# Patient Record
Sex: Male | Born: 1990 | Race: Black or African American | Hispanic: No | Marital: Single | Smoking: Current some day smoker
Health system: Southern US, Community
[De-identification: ages and names within clinical notes are randomized; demographics above are authoritative.]

---

## 2009-01-29 ENCOUNTER — Emergency Department (HOSPITAL_COMMUNITY): Admission: EM | Admit: 2009-01-29 | Discharge: 2009-01-29 | Payer: Self-pay | Admitting: Emergency Medicine

## 2010-03-08 ENCOUNTER — Emergency Department (HOSPITAL_COMMUNITY): Admission: EM | Admit: 2010-03-08 | Discharge: 2010-03-08 | Payer: Self-pay | Admitting: Emergency Medicine

## 2010-09-23 LAB — URINE MICROSCOPIC-ADD ON

## 2010-09-23 LAB — URINALYSIS, ROUTINE W REFLEX MICROSCOPIC
Glucose, UA: NEGATIVE mg/dL
Specific Gravity, Urine: 1.015 (ref 1.005–1.030)
pH: 6 (ref 5.0–8.0)

## 2010-09-23 LAB — DIFFERENTIAL
Lymphs Abs: 3.7 10*3/uL (ref 0.7–4.0)
Monocytes Absolute: 0.3 10*3/uL (ref 0.1–1.0)
Monocytes Relative: 5 % (ref 3–12)
Neutro Abs: 2.3 10*3/uL (ref 1.7–7.7)
Neutrophils Relative %: 35 % — ABNORMAL LOW (ref 43–77)

## 2010-09-23 LAB — CBC
Hemoglobin: 14.3 g/dL (ref 13.0–17.0)
MCHC: 32.5 g/dL (ref 30.0–36.0)
Platelets: 267 10*3/uL (ref 150–400)
RDW: 13.5 % (ref 11.5–15.5)

## 2010-09-23 LAB — COMPREHENSIVE METABOLIC PANEL
ALT: 10 U/L (ref 0–53)
Albumin: 4.3 g/dL (ref 3.5–5.2)
BUN: 9 mg/dL (ref 6–23)
Calcium: 9.3 mg/dL (ref 8.4–10.5)
Glucose, Bld: 151 mg/dL — ABNORMAL HIGH (ref 70–99)
Sodium: 140 mEq/L (ref 135–145)
Total Protein: 7.4 g/dL (ref 6.0–8.3)

## 2010-09-23 LAB — ETHANOL: Alcohol, Ethyl (B): 177 mg/dL — ABNORMAL HIGH (ref 0–10)

## 2010-09-23 LAB — RAPID URINE DRUG SCREEN, HOSP PERFORMED
Amphetamines: NOT DETECTED
Barbiturates: NOT DETECTED

## 2012-09-27 ENCOUNTER — Emergency Department (HOSPITAL_COMMUNITY)
Admission: EM | Admit: 2012-09-27 | Discharge: 2012-09-27 | Disposition: A | Discharge status: Home / Self Care | Payer: Self-pay

## 2013-11-29 ENCOUNTER — Emergency Department (HOSPITAL_COMMUNITY)
Admission: EM | Admit: 2013-11-29 | Discharge: 2013-11-29 | Disposition: A | Discharge status: Home / Self Care | Payer: Self-pay | Attending: Emergency Medicine | Admitting: Emergency Medicine

## 2013-11-29 ENCOUNTER — Encounter (HOSPITAL_COMMUNITY): Payer: Self-pay | Admitting: Emergency Medicine

## 2013-11-29 DIAGNOSIS — M62830 Muscle spasm of back: Secondary | ICD-10-CM

## 2013-11-29 DIAGNOSIS — F17200 Nicotine dependence, unspecified, uncomplicated: Secondary | ICD-10-CM | POA: Insufficient documentation

## 2013-11-29 DIAGNOSIS — M5380 Other specified dorsopathies, site unspecified: Secondary | ICD-10-CM | POA: Insufficient documentation

## 2013-11-29 MED ORDER — CYCLOBENZAPRINE HCL 5 MG PO TABS: 5.0000 mg | ORAL_TABLET | Freq: Two times a day (BID) | ORAL | Status: DC | PRN

## 2013-11-29 MED ORDER — HYDROCODONE-ACETAMINOPHEN 5-325 MG PO TABS: 1.0000 | ORAL_TABLET | Freq: Four times a day (QID) | ORAL | Status: DC | PRN

## 2013-11-29 NOTE — ED Notes (Signed)
Started with right subscapular pain last pm. States was helping someone 'move' this weekend. Pain worse with deep breathing. Lungs clear.

## 2013-11-29 NOTE — ED Provider Notes (Signed)
  Medical screening examination/treatment/procedure(s) were performed by non-physician practitioner and as supervising physician I was immediately available for consultation/collaboration.   EKG Interpretation None         Gerhard Munchobert Kamyla Olejnik, MD 11/29/13 1228

## 2013-11-29 NOTE — Discharge Instructions (Signed)
Musculoskeletal Pain °Musculoskeletal pain is muscle and boney aches and pains. These pains can occur in any part of the body. Your caregiver may treat you without knowing the cause of the pain. They may treat you if blood or urine tests, X-rays, and other tests were normal.  °CAUSES °There is often not a definite cause or reason for these pains. These pains may be caused by a type of germ (virus). The discomfort may also come from overuse. Overuse includes working out too hard when your body is not fit. Boney aches also come from weather changes. Bone is sensitive to atmospheric pressure changes. °HOME CARE INSTRUCTIONS  °· Ask when your test results will be ready. Make sure you get your test results. °· Only take over-the-counter or prescription medicines for pain, discomfort, or fever as directed by your caregiver. If you were given medications for your condition, do not drive, operate machinery or power tools, or sign legal documents for 24 hours. Do not drink alcohol. Do not take sleeping pills or other medications that may interfere with treatment. °· Continue all activities unless the activities cause more pain. When the pain lessens, slowly resume normal activities. Gradually increase the intensity and duration of the activities or exercise. °· During periods of severe pain, bed rest may be helpful. Lay or sit in any position that is comfortable. °· Putting ice on the injured area. °· Put ice in a bag. °· Place a towel between your skin and the bag. °· Leave the ice on for 15 to 20 minutes, 3 to 4 times a day. °· Follow up with your caregiver for continued problems and no reason can be found for the pain. If the pain becomes worse or does not go away, it may be necessary to repeat tests or do additional testing. Your caregiver may need to look further for a possible cause. °SEEK IMMEDIATE MEDICAL CARE IF: °· You have pain that is getting worse and is not relieved by medications. °· You develop chest pain  that is associated with shortness or breath, sweating, feeling sick to your stomach (nauseous), or throw up (vomit). °· Your pain becomes localized to the abdomen. °· You develop any new symptoms that seem different or that concern you. °MAKE SURE YOU:  °· Understand these instructions. °· Will watch your condition. °· Will get help right away if you are not doing well or get worse. °Document Released: 06/03/2005 Document Revised: 08/26/2011 Document Reviewed: 02/05/2013 °Exit[employer]® Patient Information ©2014 [employer]. ° °

## 2013-11-29 NOTE — ED Provider Notes (Signed)
CSN: [id number]     Arrival date & time 11/29/13  [id number] History  This chart was scribed for non-physician practitioner Teressa LowerVrinda Shanikia Kernodle, NP working with Gerhard Munchobert Lockwood, MD by Joaquin MusicKristina Sanchez-Matthews, ED Scribe. This patient was seen in room TR08C/TR08C and the patient's care was started at 10:00 AM .   Chief Complaint  Patient presents with  . Back Pain   The history is provided by the patient. No language interpreter was used.   HPI Comments: Jonathon Schneider is a 23 y.o. male who presents to the Emergency Department complaining of back pain and spasms that began yesterday afternoon. Pt states he suspects he is having back spasms and states he was lifting and moving items yesterday evening; he was having pain while sleeping due to spasms and back pain. Denies having numbness, weakness, and health disorders.  History reviewed. No pertinent past medical history. History reviewed. No pertinent past surgical history. No family history on file. History  Substance Use Topics  . Smoking status: Current Some Day Smoker  . Smokeless tobacco: Not on file  . Alcohol Use: Yes    Review of Systems  Musculoskeletal: Positive for back pain and myalgias. Negative for gait problem.  Neurological: Negative for weakness and numbness.  All other systems reviewed and are negative.  Allergies  Review of patient's allergies indicates not on file.  Home Medications   Prior to Admission medications   Not on File   BP 121/81  Pulse 73  Temp(Src) 98.5 F (36.9 C) (Oral)  SpO2 100%  Physical Exam  Nursing note and vitals reviewed. Constitutional: He is oriented to person, place, and time. He appears well-developed and well-nourished. No distress.  HENT:  Head: Normocephalic and atraumatic.  Eyes: EOM are normal.  Neck: Neck supple. No tracheal deviation present.  Cardiovascular: Normal rate.   Pulmonary/Chest: Effort normal. No respiratory distress.  Musculoskeletal: Normal range of motion.  R  thoracic paraspinal tenderness and muscle spasms. Moving all extremities without any problems.  Neurological: He is alert and oriented to person, place, and time.  Skin: Skin is warm and dry.  Psychiatric: He has a normal mood and affect. His behavior is normal.   ED Course  Procedures  DIAGNOSTIC STUDIES: Oxygen Saturation is 100% on RA, normal by my interpretation.    COORDINATION OF CARE: 10:02 AM-Discussed treatment plan which includes discharge pt with pain medication and muscle relaxer. Encouraged pt to avoid heavy lifting. Pt agreed to plan.   Labs Review Labs Reviewed - No data to display  Imaging Review No results found.   EKG Interpretation None     MDM   Final diagnoses:  Muscle spasm of back    neurovascularly intact. Don't think imaging is needed at this time. Pt given ortho follow up as needed  I personally performed the services described in this documentation, which was scribed in my presence. The recorded information has been reviewed and is accurate.    Teressa LowerVrinda Jamair Cato, NP 11/29/13 1009

## 2013-12-25 ENCOUNTER — Emergency Department (HOSPITAL_COMMUNITY)
Admission: EM | Admit: 2013-12-25 | Discharge: 2013-12-25 | Disposition: A | Discharge status: Home / Self Care | Payer: No Typology Code available for payment source | Attending: Emergency Medicine | Admitting: Emergency Medicine

## 2013-12-25 ENCOUNTER — Encounter (HOSPITAL_COMMUNITY): Payer: Self-pay | Admitting: Emergency Medicine

## 2013-12-25 DIAGNOSIS — Y9289 Other specified places as the place of occurrence of the external cause: Secondary | ICD-10-CM | POA: Insufficient documentation

## 2013-12-25 DIAGNOSIS — S46909A Unspecified injury of unspecified muscle, fascia and tendon at shoulder and upper arm level, unspecified arm, initial encounter: Secondary | ICD-10-CM | POA: Insufficient documentation

## 2013-12-25 DIAGNOSIS — F17200 Nicotine dependence, unspecified, uncomplicated: Secondary | ICD-10-CM | POA: Insufficient documentation

## 2013-12-25 DIAGNOSIS — S4980XA Other specified injuries of shoulder and upper arm, unspecified arm, initial encounter: Secondary | ICD-10-CM | POA: Insufficient documentation

## 2013-12-25 DIAGNOSIS — M79622 Pain in left upper arm: Secondary | ICD-10-CM

## 2013-12-25 DIAGNOSIS — Y9389 Activity, other specified: Secondary | ICD-10-CM | POA: Insufficient documentation

## 2013-12-25 MED ORDER — IBUPROFEN 800 MG PO TABS: 800.0000 mg | ORAL_TABLET | Freq: Three times a day (TID) | ORAL | Status: DC | PRN

## 2013-12-25 MED ORDER — CYCLOBENZAPRINE HCL 10 MG PO TABS: 10.0000 mg | ORAL_TABLET | Freq: Three times a day (TID) | ORAL | Status: DC | PRN
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
## 2013-12-25 NOTE — ED Provider Notes (Signed)
Medical screening examination/treatment/procedure(s) were performed by non-physician practitioner and as supervising physician I was immediately available for consultation/collaboration.   EKG Interpretation None        Glynn OctaveStephen Kazuki Ingle, MD 12/25/13 2306

## 2014-02-19 ENCOUNTER — Emergency Department (HOSPITAL_COMMUNITY)
Admission: EM | Admit: 2014-02-19 | Discharge: 2014-02-19 | Disposition: A | Discharge status: Home / Self Care | Payer: Self-pay | Attending: Emergency Medicine | Admitting: Emergency Medicine

## 2014-02-19 ENCOUNTER — Emergency Department (HOSPITAL_COMMUNITY): Payer: Self-pay

## 2014-02-19 ENCOUNTER — Encounter (HOSPITAL_COMMUNITY): Payer: Self-pay | Admitting: Emergency Medicine

## 2014-02-19 DIAGNOSIS — M25521 Pain in right elbow: Secondary | ICD-10-CM

## 2014-02-19 DIAGNOSIS — S59909A Unspecified injury of unspecified elbow, initial encounter: Secondary | ICD-10-CM | POA: Insufficient documentation

## 2014-02-19 DIAGNOSIS — Y929 Unspecified place or not applicable: Secondary | ICD-10-CM | POA: Insufficient documentation

## 2014-02-19 DIAGNOSIS — S59919A Unspecified injury of unspecified forearm, initial encounter: Principal | ICD-10-CM

## 2014-02-19 DIAGNOSIS — R296 Repeated falls: Secondary | ICD-10-CM | POA: Insufficient documentation

## 2014-02-19 DIAGNOSIS — Y939 Activity, unspecified: Secondary | ICD-10-CM | POA: Insufficient documentation

## 2014-02-19 DIAGNOSIS — S6990XA Unspecified injury of unspecified wrist, hand and finger(s), initial encounter: Principal | ICD-10-CM

## 2014-02-19 DIAGNOSIS — F17200 Nicotine dependence, unspecified, uncomplicated: Secondary | ICD-10-CM | POA: Insufficient documentation

## 2014-02-19 DIAGNOSIS — W19XXXA Unspecified fall, initial encounter: Secondary | ICD-10-CM

## 2014-02-19 MED ORDER — HYDROCODONE-ACETAMINOPHEN 5-325 MG PO TABS: 1.0000 | ORAL_TABLET | ORAL | Status: AC | PRN

## 2014-02-19 MED ORDER — HYDROCODONE-ACETAMINOPHEN 5-325 MG PO TABS
1.0000 | ORAL_TABLET | Freq: Once | ORAL | Status: AC
  Administered 2014-02-19: 1 via ORAL
  Filled 2014-02-19: qty 1

## 2014-02-19 NOTE — Discharge Instructions (Signed)
1. Medications: vicodin for severe pain, ibuprofen for moderate pain, usual home medications 2. Treatment: rest, drink plenty of fluids, ice, use sling 3. Follow Up: Please followup with your primary doctor for discussion of your diagnoses and further evaluation after today's visit; if you do not have a primary care doctor use the resource guide provided to find one;     Arthralgia Your caregiver has diagnosed you as suffering from an arthralgia. Arthralgia means there is pain in a joint. This can come from many reasons including:  Bruising the joint which causes soreness (inflammation) in the joint.  Wear and tear on the joints which occur as we grow older (osteoarthritis).  Overusing the joint.  Various forms of arthritis.  Infections of the joint. Regardless of the cause of pain in your joint, most of these different pains respond to anti-inflammatory drugs and rest. The exception to this is when a joint is infected, and these cases are treated with antibiotics, if it is a bacterial infection. HOME CARE INSTRUCTIONS   Rest the injured area for as long as directed by your caregiver. Then slowly start using the joint as directed by your caregiver and as the pain allows. Crutches as directed may be useful if the ankles, knees or hips are involved. If the knee was splinted or casted, continue use and care as directed. If an stretchy or elastic wrapping bandage has been applied today, it should be removed and re-applied every 3 to 4 hours. It should not be applied tightly, but firmly enough to keep swelling down. Watch toes and feet for swelling, bluish discoloration, coldness, numbness or excessive pain. If any of these problems (symptoms) occur, remove the ace bandage and re-apply more loosely. If these symptoms persist, contact your caregiver or return to this location.  For the first 24 hours, keep the injured extremity elevated on pillows while lying down.  Apply ice for 15-20 minutes to  the sore joint every couple hours while awake for the first half day. Then 03-04 times per day for the first 48 hours. Put the ice in a plastic bag and place a towel between the bag of ice and your skin.  Wear any splinting, casting, elastic bandage applications, or slings as instructed.  Only take over-the-counter or prescription medicines for pain, discomfort, or fever as directed by your caregiver. Do not use aspirin immediately after the injury unless instructed by your physician. Aspirin can cause increased bleeding and bruising of the tissues.  If you were given crutches, continue to use them as instructed and do not resume weight bearing on the sore joint until instructed. Persistent pain and inability to use the sore joint as directed for more than 2 to 3 days are warning signs indicating that you should see a caregiver for a follow-up visit as soon as possible. Initially, a hairline fracture (break in bone) may not be evident on X-rays. Persistent pain and swelling indicate that further evaluation, non-weight bearing or use of the joint (use of crutches or slings as instructed), or further X-rays are indicated. X-rays may sometimes not show a small fracture until a week or 10 days later. Make a follow-up appointment with your own caregiver or one to whom we have referred you. A radiologist (specialist in reading X-rays) may read your X-rays. Make sure you know how you are to obtain your X-ray results. Do not assume everything is normal if you do not hear from [place]. SEEK MEDICAL CARE IF: Bruising, swelling, or pain increases.  SEEK IMMEDIATE MEDICAL CARE IF:   Your fingers or toes are numb or blue.  The pain is not responding to medications and continues to stay the same or get worse.  The pain in your joint becomes severe.  You develop a fever over 102 F (38.9 C).  It becomes impossible to move or use the joint. MAKE SURE YOU:   Understand these instructions.  Will watch your  condition.  Will get help right away if you are not doing well or get worse. Document Released: 06/03/2005 Document Revised: 08/26/2011 Document Reviewed: 01/20/2008 [facility] Patient Information 2015 [place], [place]. This information is not intended to replace advice given to you by your health care provider. Make sure you discuss any questions you have with your health care provider.    Emergency Department Resource Guide 1) Find a Doctor and Pay Out of Pocket Although you won't have to find out who is covered by your insurance plan, it is a good idea to ask around and get recommendations. You will then need to call the office and see if the doctor you have chosen will accept you as a new patient and what types of options they offer for patients who are self-pay. Some doctors offer discounts or will set up payment plans for their patients who do not have insurance, but you will need to ask so you aren't surprised when you get to your appointment.  2) Contact Your Local Health Department Not all health departments have doctors that can see patients for sick visits, but many do, so it is worth a call to see if yours does. If you don't know where your local health department is, you can check in your phone book. The CDC also has a tool to help you locate your state's health department, and many state websites also have listings of all of their local health departments.  3) Find a Walk-in Clinic If your illness is not likely to be very severe or complicated, you may want to try a walk in clinic. These are popping up all over the country in pharmacies, drugstores, and shopping centers. They're usually staffed by nurse practitioners or physician assistants that have been trained to treat common illnesses and complaints. They're usually fairly quick and inexpensive. However, if you have serious medical issues or chronic medical problems, these are probably not your best option.  No Primary Care  Doctor: - Call Health Connect at  [phone] - they can help you locate a primary care doctor that  accepts your insurance, provides certain services, etc. - Physician Referral Service- [phone]  Chronic Pain Problems: Organization         Address  Phone   Notes  [facility]  [phone] Patients need to be referred by their primary care doctor.   Medication Assistance: Organization         Address  Phone   Notes  [facility] Medication [facility] [address] [place] [place], [place] [address] [phone] --Must be a resident of [facility] -- Must have NO insurance coverage whatsoever (no Medicaid/ Medicare, etc.) -- The pt. MUST have a primary care doctor that directs their care regularly and follows them in the community   MedAssist  [phone]   [employer]  [phone]    Agencies that provide inexpensive medical care: Organization         Address  Phone   Notes  [facility]  [phone]  [facility]    [phone]   [facility] [place], [place] [address] [phone]   [place]. [address], [place] [phone]   Planned Parenthood    [phone]   [facility]    [phone]   [place] and [place] [address], [place] Phone:  [phone], Fax:  [phone] Hours of Operation:  9 am - 6 pm, M-F.  Also accepts Medicaid/Medicare and self-pay.  [facility] for [place] [address] [place], [address], [place] Phone: [phone], Fax: [phone]. Hours of Operation:  8:30 am - 5:30 pm, M-F.  Also accepts Medicaid and self-pay.  [facility] [place] [address] [place] Phone: [phone]   [place], [place], [place] [phone], Ext. [phone] Mondays & Thursdays: 7-9 AM.  First 15 patients are seen on a first  come, first serve basis.    [place] Providers:  Organization         Address  Phone   Notes  [facility] [address], [place] [phone] Also accepts self-pay patients.  [facility] [address], [place]  [phone]   [place], [address], [place] [phone]   [facility] [address], [place] [phone]   Lucianne Lei [address], [place]   [phone] Only accepts [place] Access [place] patients after they have their name applied to their card.   Self-Pay (no insurance) in [facility]:  Organization         Address  Phone   Notes  Sickle Cell Patients, [facility] [place] [phone]   [facility] [place] [phone]   [facility] [place]  [place], [place], [place] [phone]   [facility]/Dr. Osei-Bonsu  [address], [place] or [address], [place] [phone] Phone number for both [place] and [place] locations is the same.  Urgent Medical and [facility] [address] [place] [phone]   [facility] [address] [place] or [address] [phone]   [facility] [place] [address] [place] [phone], phone; [phone], fax Sees patients 1st and 3rd Saturday of every month.  Must not qualify for public or private insurance (i.e. Medicaid, Medicare,  Health Choice, Veterans' Benefits)  Household income should be no more than 200% of the poverty level The clinic cannot treat you if you are pregnant or think you are pregnant  Sexually transmitted diseases are not treated at the clinic.    Dental Care: Organization          Address  Phone  Notes  [facility] of [facility] [place] [phone] Accepts children up to age 37 who are enrolled in [place] or [place]; pregnant women with a Medicaid card; and children who have applied for Medicaid or  Health Choice, but were declined, whose parents can pay a reduced fee at time of service.  [facility] Department of [facility]  [address],  [place] [phone] Accepts children up to age 97 who are enrolled in Medicaid or [place] Health Choice; pregnant women with a Medicaid card; and children who have applied for Medicaid or [place] Health Choice, but were declined, whose parents can pay a reduced fee at time of service.  [place] Adult [facility] Access PROGRAM  [place] [phone] Patients are seen by appointment only. Walk-ins are not accepted. [place] will see patients 6 years of age and older. Monday - Tuesday (8am-5pm) Most Wednesdays (8:30-5pm) $30 per visit, cash only  [facility] Adult Dental Access PROGRAM  [address], [facility] [phone] Patients are seen by appointment only. Walk-ins are not accepted. [place] will see patients 70 years of age and older. One Wednesday Evening (Monthly: Volunteer Based).  $30 per visit, cash only  [place]  [phone] for adults; Children under age 35, call Graduate Pediatric Dentistry at [phone]. Children aged 36-14, please call [phone] to request a pediatric application.  Dental services are provided in all areas of dental care including fillings, crowns and bridges, complete and partial dentures, implants, gum treatment, root canals, and extractions. Preventive care is also provided. Treatment is provided to both adults and children. Patients are selected via a lottery and there is often a waiting list.   [facility] [address], [place]  [phone] [email or web address]   [facility] [address] [place], [place] [phone], Ext. [phone] Second and Fourth Thursday of each month, opens at 6:30 AM; Clinic ends at 9 AM.  Patients are seen on a first-come first-served basis, and a limited number are seen during each clinic.   [facility]  [address] Hillard Danker [place], [place] [phone]   Eligibility Requirements You must have lived in [place], [place], or [place] counties for at least the last three months.   You cannot be eligible for state or federal sponsored [employer], including [employer], [place], or [employer].   You generally cannot be eligible for healthcare insurance through your employer.    How to apply: Eligibility screenings are held every Tuesday and Wednesday afternoon from 1:00 pm until 4:00 pm. You do not need an appointment for the interview!  [facility] [address] [place], [place]   [place]  [place] Department  [place]  [phone]    Behavioral Health Resources in the Community: Intensive Outpatient Programs Organization         Address  Phone  Notes  [place]. [address] [place], [place] [phone]   [facility] Outpatient [address] [place], [place]   ADS: Alcohol & [facility] S[facility] [address] [place], [place]   [place] [address]  [place], [place] or [phone]   Substance Abuse Resources Organization         Address  Phone  Notes  Alcohol and Drug Services  [phone]   [place]  [phone]   The [place]   Chinita Pester  [phone]   Residential & Outpatient Substance Abuse Program  [phone]   Psychological  Services Organization         Address  Phone  Notes  [place]  [place]  [phone]  [phone] [address] [place] or [phone]    Mobile Crisis Teams Organization         Address  Phone  Notes  [facility], Mobile Crisis Care Unit  [phone]   [facility]  [address] [place], [place]   [place] [address], [place] [phone]    Self-Help/Support Groups Organization         Address  Phone             Notes  [place]. of [place] - variety of support groups  [place] Call for more information  Narcotics Anonymous (NA), [facility] [address], [employer] [place]  2 meetings at this location   Special educational needs teacher         Address  Phone  Notes  ASAP Residential Treatment [place],    [place]  [phone]   [facility]  [address], [place] [address], [place], [place]   [place], [place] [phone] Admissions: 8am-3pm M-F  Incentives Substance [place] [address]    [place], [place] [phone]   [facility] [address] [place], [place], [place]   [facility] [address],  [place], [place]   [facility] Programs - Intensive Outpatient [address] Kristeen Mans [address], [place], [place]   [facility] ([place].,  [place], [place] [phone] or [phone]   [facility] Treatment Services (RTS) [address] [place], [place] Accepts Medicaid  [facility] [place] [address],  [place] [phone] Substance Abuse/Addiction Treatment   [facility] Organization         Address  Phone  Notes  [facility]  [phone]   Domenic Schwab, PhD [address] Arlis Porta [place], [place]   [phone] or [phone]   [place], [place] [phone]   [facility] [address], [place], [place] [phone] Insurance/Medicaid/sponsorship through [facility] and Families [address] [place]                                    [place], [place] [phone] [place] [address] [place] [address], [place] [phone]    Dr. Adele Schilder  [phone]   [facility] Dept. 1) [address] [place] 2) [place] 3)  [place] 65, [place] [phone]  [phone]   [place] [phone] or [phone] (After Hours)

## 2014-02-19 NOTE — ED Notes (Signed)
Reports falling last night and now having pain to right elbow and forearm.

## 2014-02-19 NOTE — ED Provider Notes (Signed)
CSN: [id number]     Arrival date & time 02/19/14  1832 History   None    This chart was scribed for non-physician practitioner, Dierdre Forth PA-C working with Jonathon Maw Ward, DO by Arlan Organ, ED Scribe. This patient was seen in room TR10C/TR10C and the patient's care was started at 10:06 PM.   Chief Complaint  Patient presents with  . Fall  . Arm Pain   The history is provided by the patient. No language interpreter was used.    HPI Comments: Jonathon Schneider is a 23 y.o. male who presents to the Emergency Department complaining of a fall that occurred last night. Pt states he is unaware of mechanism of fall as he does not remember what happened. Pt admits to heavy EtOH usage last night.  However, pt states he is sure he landed on concrete. His friends report that he did not hit his head or have an LOC.  Now c/o constant, moderate R elbow and forearm pain that is unchanged. Currently rates pain at rest 3/10 and 7/10 with movement. Pain is exacerbated with all movements of the shoulder that require elbow movement. No alleviating factors at this time. He has tried OTC pain medications without any improvement for symptoms. Last dose about 4-5 hours prior to arrival. He denies any fever or chills. No numbness or loss of sensation. No known allergies to medications. No other concerns this visit.  History reviewed. No pertinent past medical history. History reviewed. No pertinent past surgical history. History reviewed. No pertinent family history. History  Substance Use Topics  . Smoking status: Current Some Day Smoker  . Smokeless tobacco: Not on file  . Alcohol Use: Yes    Review of Systems  Constitutional: Negative for fever and chills.  Gastrointestinal: Negative for nausea and vomiting.  Musculoskeletal: Positive for arthralgias and joint swelling. Negative for back pain, neck pain and neck stiffness.  Skin: Negative for wound.  Neurological: Negative for weakness and numbness.   Hematological: Does not bruise/bleed easily.  Psychiatric/Behavioral: The patient is not nervous/anxious.   All other systems reviewed and are negative.     Allergies  Review of patient's allergies indicates no known allergies.  Home Medications   Prior to Admission medications   Medication Sig Start Date End Date Taking? Authorizing Provider  HYDROcodone-acetaminophen (NORCO/VICODIN) 5-325 MG per tablet Take 1 tablet by mouth every 4 (four) hours as needed for moderate pain or severe pain. 02/19/14   Muath Hallam, PA-C   Triage Vitals: BP 121/62  Pulse 71  Temp(Src) 98.5 F (36.9 C) (Oral)  Resp 16  SpO2 100%   Physical Exam  Nursing note and vitals reviewed. Constitutional: He appears well-developed and well-nourished. No distress.  HENT:  Head: Normocephalic and atraumatic.  Eyes: Conjunctivae are normal.  Neck: Normal range of motion.  Cardiovascular: Normal rate, regular rhythm, normal heart sounds and intact distal pulses.   No murmur heard. Capillary refill less than 3 seconds  Pulmonary/Chest: Effort normal and breath sounds normal.  Musculoskeletal: He exhibits tenderness. He exhibits no edema.  FROM of R shoulder, R wrist, and all fingers of R hand without any pain Full active ROM of elbow without significant pain swelling noted to R elbow and proximal forearm Pain to palpation of olecranon  No laceration or abrasions to RUE  Neurological: He is alert. Coordination normal.  Sensation intact dull and sharp Strength 5/5 in RUE except flexion and extension of R elbow which is 3/5 due to pain  Skin: Skin is warm and dry. He is not diaphoretic.  No tenting of the skin  Psychiatric: He has a normal mood and affect.    ED Course  Procedures (including critical care time)  DIAGNOSTIC STUDIES: Oxygen Saturation is 99% on RA, Normal by my interpretation.    COORDINATION OF CARE: 10:06 PM- Will give Norco to help manage symptoms. Will order DG elbow  complete R. Discussed treatment plan with pt at bedside and pt agreed to plan.     Labs Review Labs Reviewed - No data to display  Imaging Review Dg Elbow Complete Right  02/19/2014   CLINICAL DATA:  Right elbow pain posteriorly after a fall yesterday.  EXAM: RIGHT ELBOW - COMPLETE 3+ VIEW  COMPARISON:  None.  FINDINGS: There is no evidence of fracture, dislocation, or joint effusion. There is no evidence of arthropathy or other focal bone abnormality. Soft tissues are unremarkable.  IMPRESSION: Negative.   Electronically Signed   By: Burman Nieves M.D.   On: 02/19/2014 21:09     EKG Interpretation None      MDM   Final diagnoses:  Fall, initial encounter  Right elbow pain   Jonathon Schneider presents with right elbow pain after fall.  No pain anywhere else.  Patient X-Ray negative for obvious fracture or dislocation. Pain managed in ED. Pt advised to follow up with orthopedics in 1 week if symptoms persist for possibility of missed fracture diagnosis. Patient given sling while in ED, conservative therapy recommended and discussed. Patient will be dc home & is agreeable with above plan.  BP 121/62  Pulse 71  Temp(Src) 98.5 F (36.9 C) (Oral)  Resp 16  SpO2 100%  I personally performed the services described in this documentation, which was scribed in my presence. The recorded information has been reviewed and is accurate.    Jonathon Client [place], PA-C 02/19/14 2206

## 2014-02-19 NOTE — ED Provider Notes (Signed)
Medical screening examination/treatment/procedure(s) were performed by non-physician practitioner and as supervising physician I was immediately available for consultation/collaboration.   EKG Interpretation None        Ricco Dershem N Nilaya Bouie, DO 02/19/14 2318 

## 2016-10-31 ENCOUNTER — Emergency Department (HOSPITAL_COMMUNITY)
Admission: EM | Admit: 2016-10-31 | Discharge: 2016-10-31 | Disposition: A | Discharge status: Home / Self Care | Payer: BLUE CROSS/BLUE SHIELD | Attending: Emergency Medicine | Admitting: Emergency Medicine

## 2016-10-31 ENCOUNTER — Other Ambulatory Visit: Payer: Self-pay

## 2016-10-31 ENCOUNTER — Emergency Department (HOSPITAL_COMMUNITY): Payer: BLUE CROSS/BLUE SHIELD

## 2016-10-31 ENCOUNTER — Encounter (HOSPITAL_COMMUNITY): Payer: Self-pay | Admitting: Emergency Medicine

## 2016-10-31 DIAGNOSIS — R0789 Other chest pain: Secondary | ICD-10-CM

## 2016-10-31 DIAGNOSIS — F17200 Nicotine dependence, unspecified, uncomplicated: Secondary | ICD-10-CM | POA: Insufficient documentation

## 2016-10-31 DIAGNOSIS — R079 Chest pain, unspecified: Secondary | ICD-10-CM | POA: Diagnosis present

## 2016-10-31 LAB — CBC
HCT: 42.7 % (ref 39.0–52.0)
HEMOGLOBIN: 14.7 g/dL (ref 13.0–17.0)
MCH: 27.4 pg (ref 26.0–34.0)
MCHC: 34.4 g/dL (ref 30.0–36.0)
MCV: 79.7 fL (ref 78.0–100.0)
PLATELETS: 259 10*3/uL (ref 150–400)
RBC: 5.36 MIL/uL (ref 4.22–5.81)
RDW: 13.5 % (ref 11.5–15.5)
WBC: 3.6 10*3/uL — ABNORMAL LOW (ref 4.0–10.5)

## 2016-10-31 LAB — BASIC METABOLIC PANEL
ANION GAP: 9 (ref 5–15)
BUN: 9 mg/dL (ref 6–20)
CALCIUM: 9.5 mg/dL (ref 8.9–10.3)
CO2: 24 mmol/L (ref 22–32)
CREATININE: 0.95 mg/dL (ref 0.61–1.24)
Chloride: 103 mmol/L (ref 101–111)
GFR calc Af Amer: >60 mL/min (ref >60)
GLUCOSE: 108 mg/dL — AB (ref 65–99)
Potassium: 4 mmol/L (ref 3.5–5.1)
Sodium: 136 mmol/L (ref 135–145)

## 2016-10-31 LAB — I-STAT TROPONIN, ED
TROPONIN I, POC: 0 ng/mL (ref 0.00–0.08)
TROPONIN I, POC: 0 ng/mL (ref 0.00–0.08)

## 2016-10-31 NOTE — ED Triage Notes (Signed)
Per EMS: pt sts chest tightness x 1 hour ago while at work with some flushing and SOB that has now resolved

## 2016-10-31 NOTE — ED Notes (Signed)
Pt ambulatory at DC, NAD. Pt verbalizes DC teaching and outpatient resources such as [employer] health and wellness.

## 2016-10-31 NOTE — ED Notes (Signed)
Lobby updated with announcement by RN, apologies for delays provided. 

## 2016-10-31 NOTE — ED Provider Notes (Signed)
[facility]-EMERGENCY DEPT Provider Note   CSN: [id number] Arrival date & time: 10/31/16  1409  By signing my name below, I, Thelma Bargeick Cochran, attest that this documentation has been prepared under the direction and in the presence of No att. providers found. Electronically Signed: Thelma BargeNick Cochran, Scribe. 10/31/16. 6:23 PM.  History   Chief Complaint Chief Complaint  Patient presents with  . Chest Pain   The history is provided by the patient. No language interpreter was used.  HPI Comments: Jonathon Schneider is a 26 y.o. male who presents to the Emergency Department complaining of acute, constant chest pain he describes as a tightness that began around 5 hours ago. He states now his symptoms have resolved. He denies fever, cough, abdominal pain, leg pain, or other associated symptoms. He denies new medications, illicit drug use, vitamins, allergies, or any other pertinent FMHx of heart disease. Pt is a smoker and frequent ETOH drinker.   History reviewed. No pertinent past medical history.  There are no active problems to display for this patient.   History reviewed. No pertinent surgical history.     Home Medications    Prior to Admission medications   Medication Sig Start Date End Date Taking? Authorizing Provider  HYDROcodone-acetaminophen (NORCO/VICODIN) 5-325 MG per tablet Take 1 tablet by mouth every 4 (four) hours as needed for moderate pain or severe pain. 02/19/14   Muthersbaugh, Dahlia ClientHannah, PA-C    Family History History reviewed. No pertinent family history.  Social History Social History  Substance Use Topics  . Smoking status: Current Some Day Smoker  . Smokeless tobacco: Not on file  . Alcohol use Yes     Allergies   Patient has no known allergies.   Review of Systems Review of Systems  Constitutional: Negative for fever.  Respiratory: Negative for cough.   Cardiovascular: Positive for chest pain.  Gastrointestinal: Negative for abdominal pain.  All other systems  reviewed and are negative.    Physical Exam Updated Vital Signs BP (!) 140/94 (BP Location: Left Arm)   Pulse 64   Temp 98.6 F (37 C) (Oral)   Resp 16   SpO2 95%   Physical Exam  Constitutional: He is oriented to person, place, and time. He appears well-developed and well-nourished.  HENT:  Head: Normocephalic and atraumatic.  Cardiovascular: Normal rate and regular rhythm.   No murmur heard. Pulmonary/Chest: Effort normal and breath sounds normal. No respiratory distress.  Abdominal: Soft. There is no tenderness. There is no rebound and no guarding.  Musculoskeletal: He exhibits no edema or tenderness.  Neurological: He is alert and oriented to person, place, and time.  Skin: Skin is warm and dry.  Psychiatric: He has a normal mood and affect. His behavior is normal.  Nursing note and vitals reviewed.    ED Treatments / Results  DIAGNOSTIC STUDIES: Oxygen Saturation is 100% on RA, normal by my interpretation.    COORDINATION OF CARE: 6:19 PM Discussed treatment plan with pt at bedside and pt agreed to plan. Labs (all labs ordered are listed, but only abnormal results are displayed) Labs Reviewed  BASIC METABOLIC PANEL - Abnormal; Notable for the following:       Result Value   Glucose, Bld 108 (*)    All other components within normal limits  CBC - Abnormal; Notable for the following:    WBC 3.6 (*)    All other components within normal limits  I-STAT TROPOININ, ED  I-STAT TROPOININ, ED    EKG  EKG Interpretation  Date/Time:  Thursday Oct 31 2016 14:10:50 EDT Ventricular Rate:  84 PR Interval:  156 QRS Duration: 82 QT Interval:  340 QTC Calculation: 401 R Axis:   65 Text Interpretation:  Normal sinus rhythm ST elevation, consider early repolarization, pericarditis, or injury Abnormal ECG no prior available for comparison Confirmed by Lincoln Brigham [phone]) on 10/31/2016 6:07:14 PM       Radiology Dg Chest 2 View  Result Date: 10/31/2016 CLINICAL DATA:   Shortness of breath, chest pain. EXAM: CHEST  2 VIEW COMPARISON:  None. FINDINGS: The heart size and mediastinal contours are within normal limits. Both lungs are clear. No pneumothorax or pleural effusion is noted. The visualized skeletal structures are unremarkable. IMPRESSION: No active cardiopulmonary disease. Electronically Signed   By: Lupita Raider, M.D.   On: 10/31/2016 14:30    Procedures Procedures (including critical care time)  Medications Ordered in ED Medications - No data to display   Initial Impression / Assessment and Plan / ED Course  I have reviewed the triage vital signs and the nursing notes.  Pertinent labs & imaging results that were available during my care of the patient were reviewed by me and considered in my medical decision making (see chart for details).     Patient here for evaluation of episode of chest pain with shortness of breath. His symptoms have resolved on ED arrival. EKG and presentation are not consistent with ACS, dissection, PE, pneumonia. Discussed with patient unclear etiology symptoms.  Discussed outpatient follow-up and return precautions.  Final Clinical Impressions(s) / ED Diagnoses   Final diagnoses:  Atypical chest pain    New Prescriptions Discharge Medication List as of 10/31/2016  7:14 PM    I personally performed the services described in this documentation, which was scribed in my presence. The recorded information has been reviewed and is accurate.    Tilden Fossa, MD 10/31/16 2027

## 2018-10-07 IMAGING — DX DG CHEST 2V
2 series · 2 of 2 positions shown · non-contrast
Comparison: None.

CLINICAL DATA: Shortness of breath, chest pain.

EXAM:
CHEST  2 VIEW

[w chest pa]
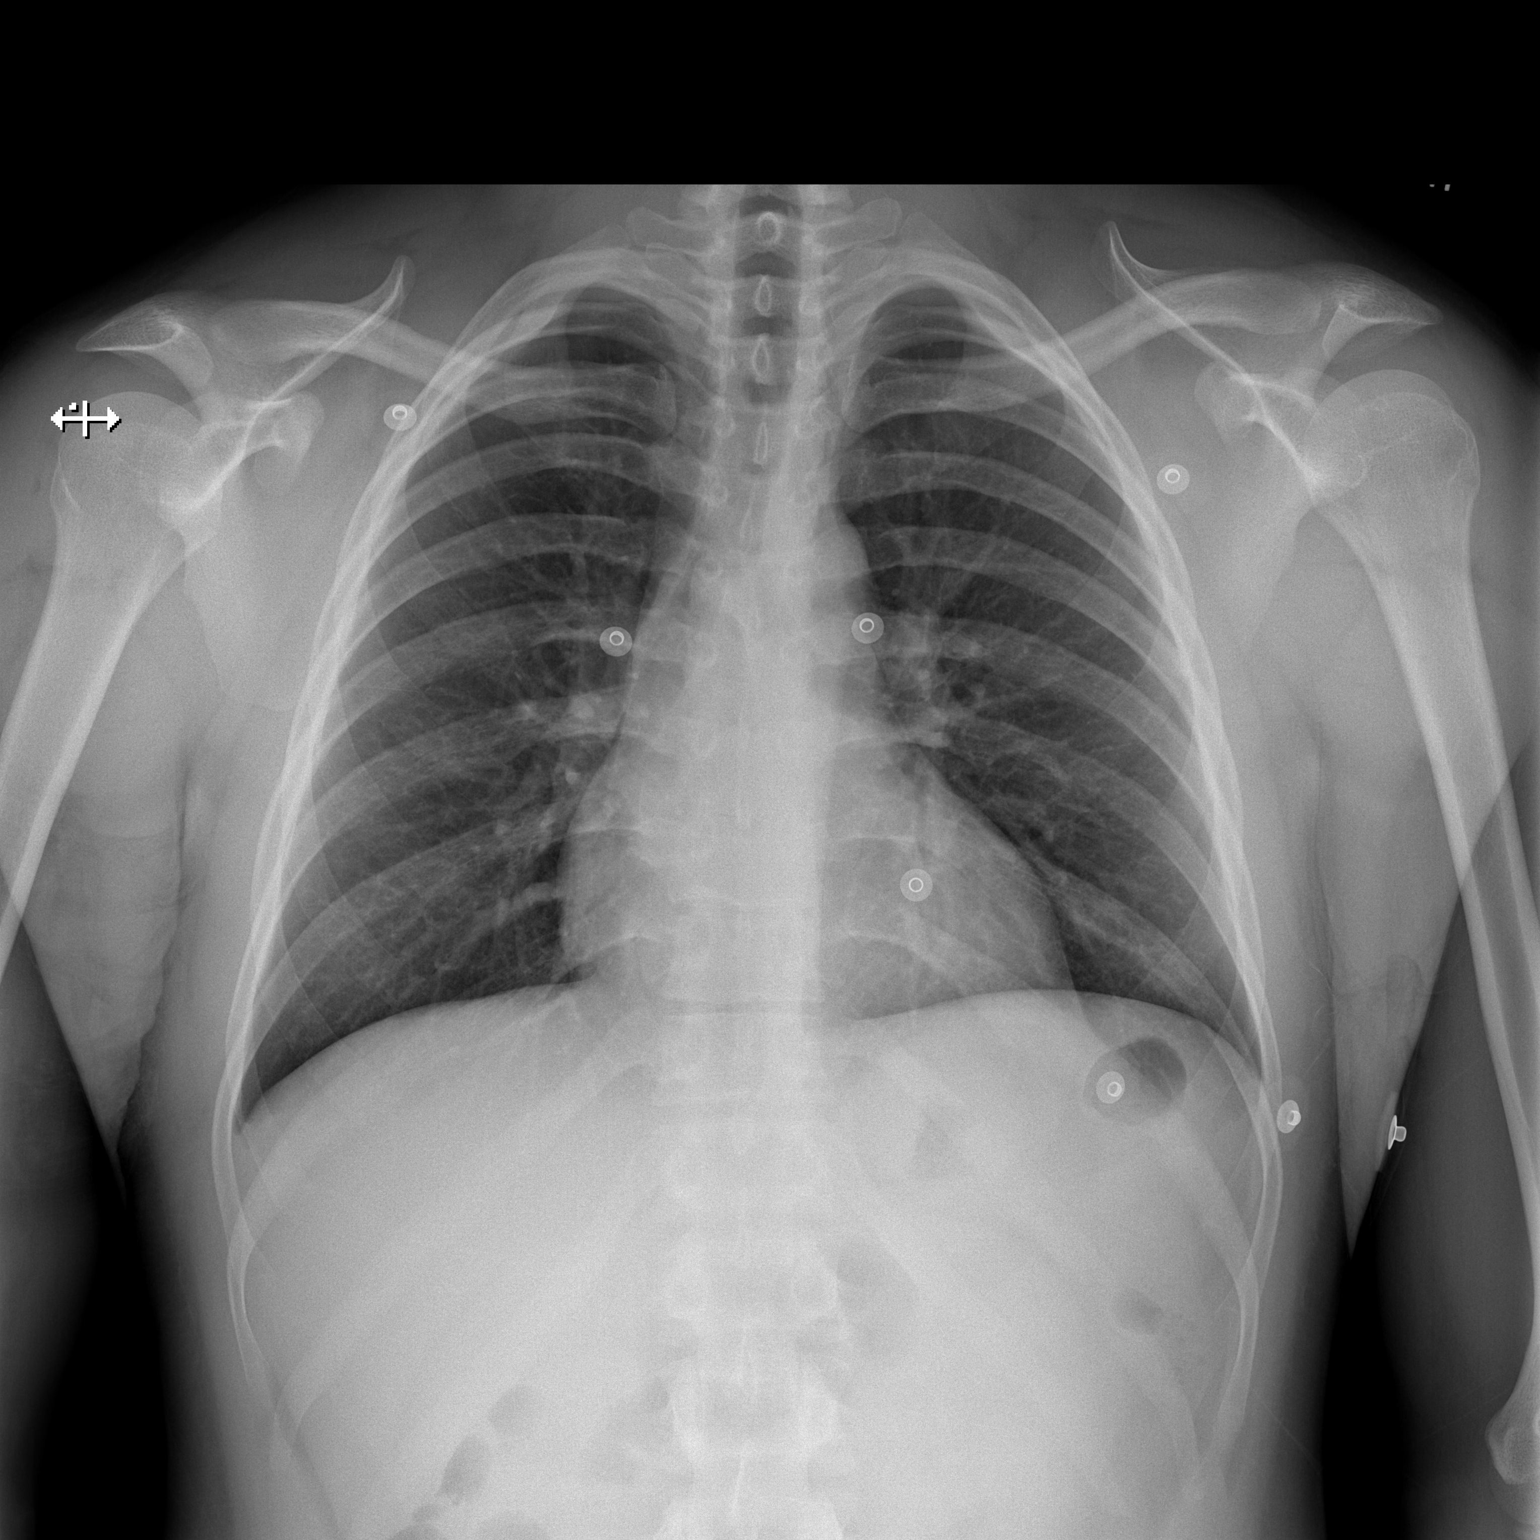

[w chest lat]
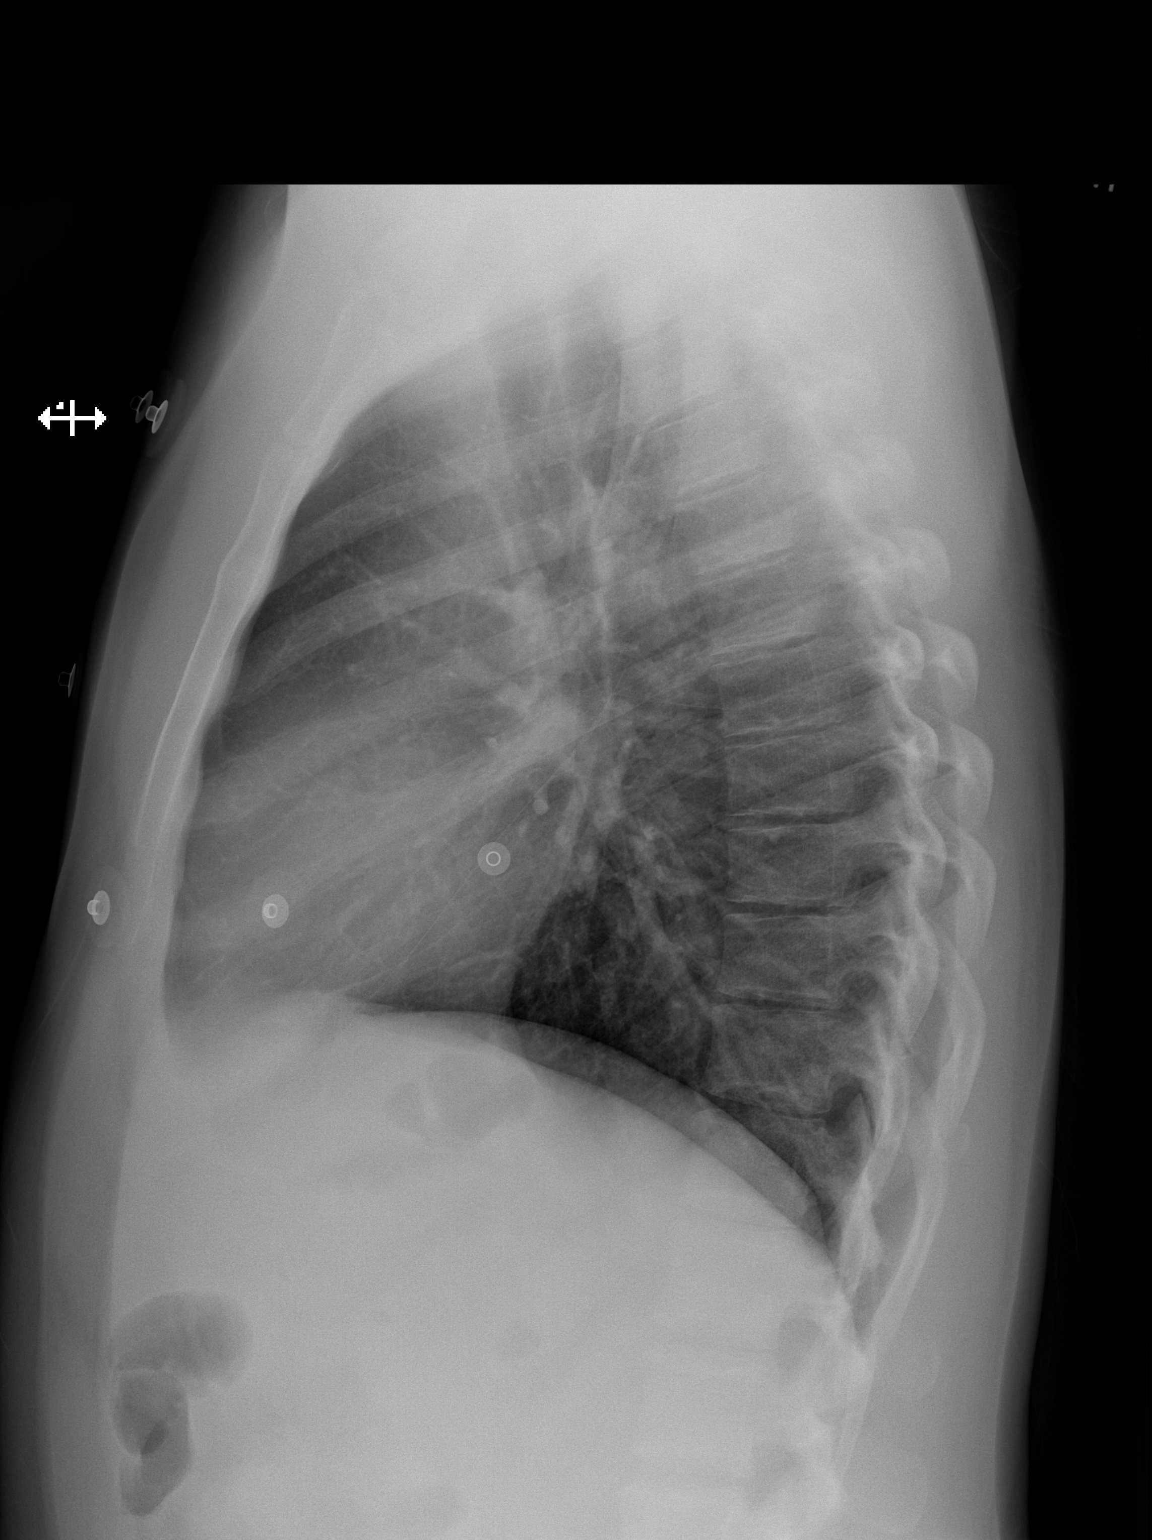

[2 of 2 positions shown; findings below may reference images not displayed]

FINDINGS: The heart size and mediastinal contours are within normal limits.
Both lungs are clear. No pneumothorax or pleural effusion is noted.
The visualized skeletal structures are unremarkable.
IMPRESSION: No active cardiopulmonary disease.
